# Patient Record
Sex: Female | Born: 1965
Health system: Southern US, Community
[De-identification: ages and names within clinical notes are randomized; demographics above are authoritative.]

## PROBLEM LIST (undated history)

## (undated) DIAGNOSIS — E119 Type 2 diabetes mellitus without complications: Secondary | ICD-10-CM

## (undated) HISTORY — DX: Type 2 diabetes mellitus without complications: E11.9

## (undated) HISTORY — PX: APPENDECTOMY: SHX54

---

## 2008-06-07 ENCOUNTER — Ambulatory Visit: Payer: Self-pay | Admitting: Diagnostic Radiology

## 2008-06-07 ENCOUNTER — Emergency Department (HOSPITAL_BASED_OUTPATIENT_CLINIC_OR_DEPARTMENT_OTHER): Admission: EM | Admit: 2008-06-07 | Discharge: 2008-06-08 | Payer: Self-pay | Admitting: Emergency Medicine

## 2008-06-08 ENCOUNTER — Ambulatory Visit: Payer: Self-pay | Admitting: Diagnostic Radiology

## 2009-12-20 IMAGING — CR DG CHEST 2V
2 series · 2 of 2 positions shown · non-contrast
Comparison: None.

CLINICAL DATA: 42-year-old female with numbness in the hands and
legs.

CHEST - 2 VIEW

[w chest pa]
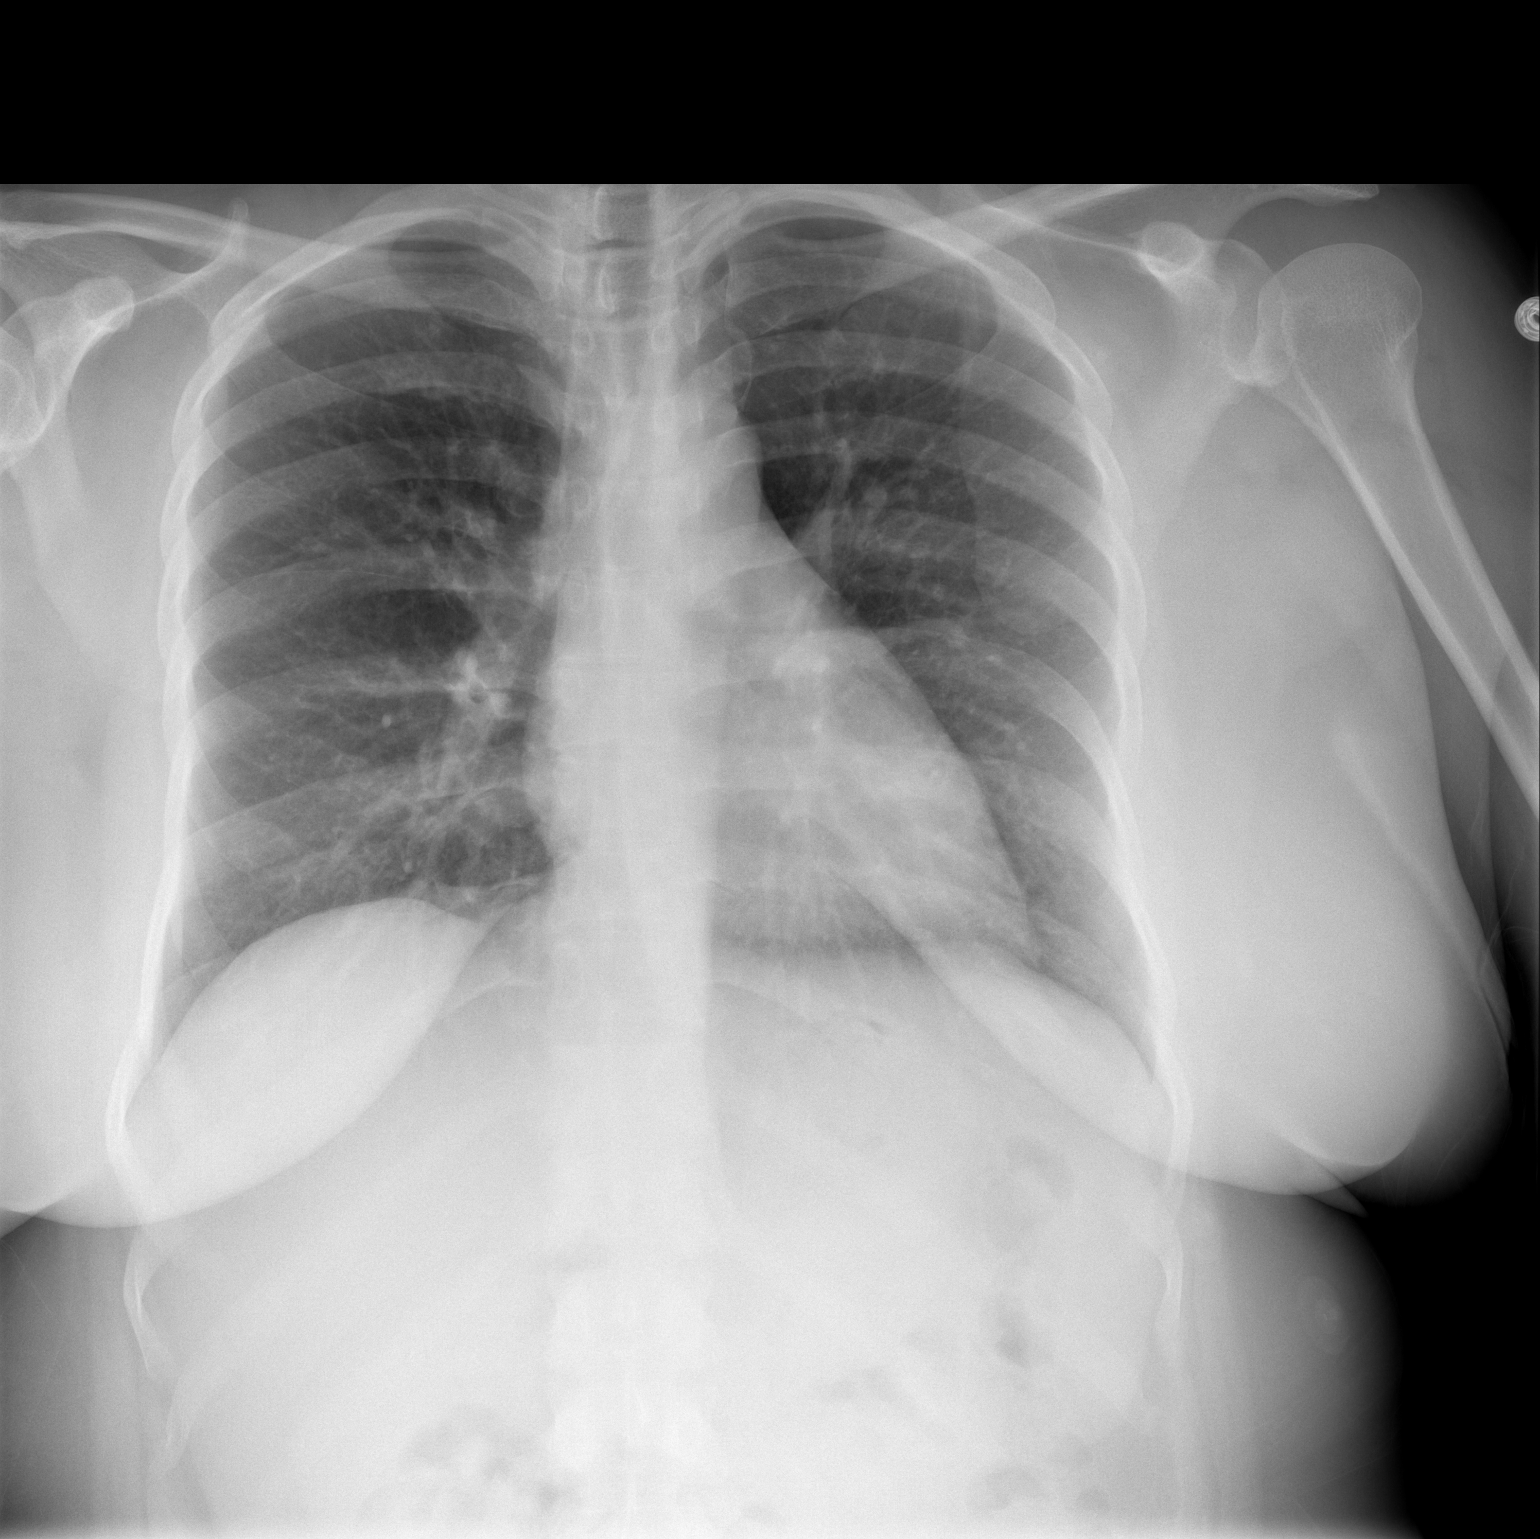

[w chest lat]
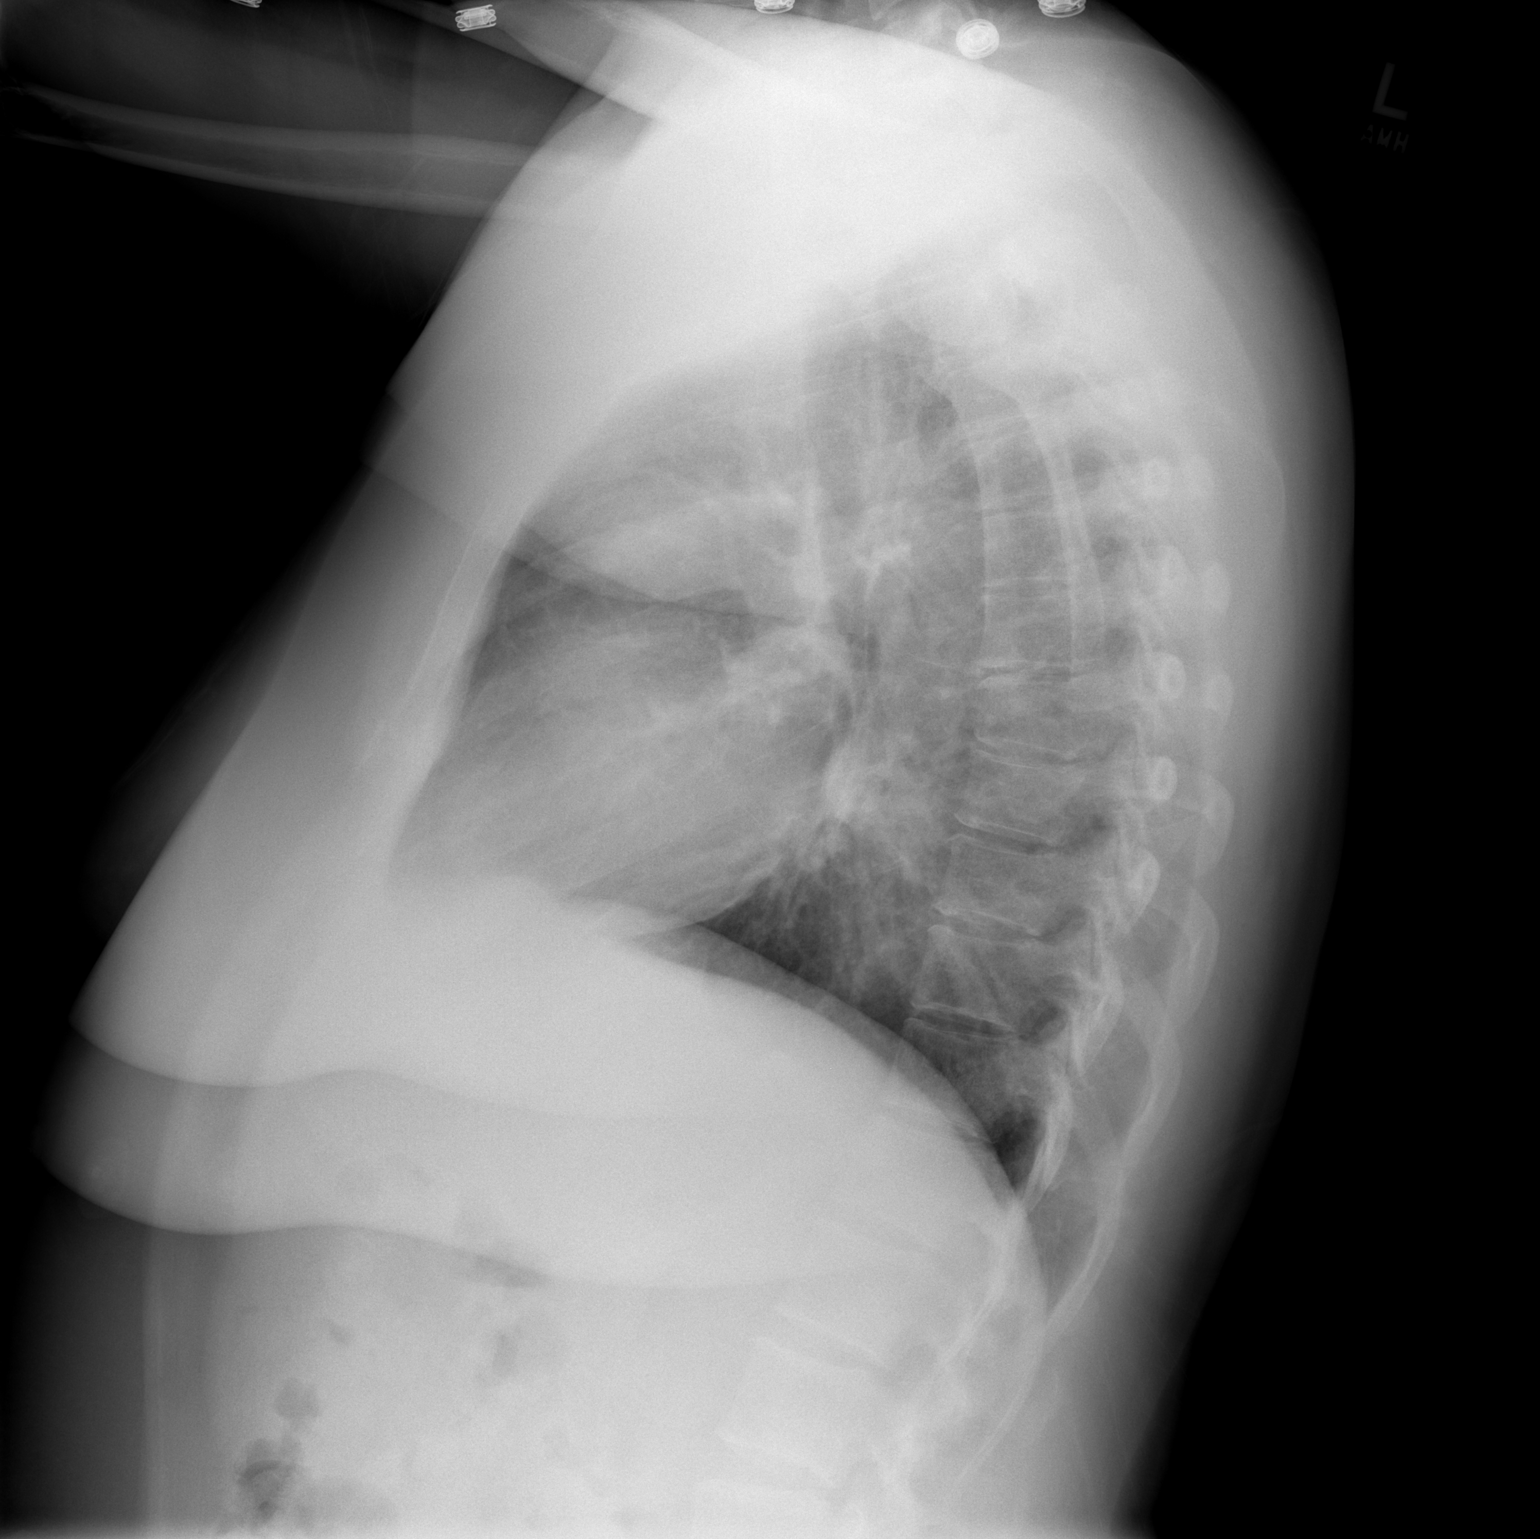

[2 of 2 positions shown; findings below may reference images not displayed]

FINDINGS: Cardiac size and mediastinal contours are within normal
limits.  Lung volumes are within normal limits.  No pneumothorax,
pulmonary edema, pleural effusion or consolidation.  Mild increased
interstitial markings which may be related to crowding.  No
confluent airspace opacity. No acute osseous abnormality
identified.
IMPRESSION: No acute cardiopulmonary abnormality.

## 2009-12-21 IMAGING — CT CT HEAD W/O CM
1 series · 16 of 30 positions shown, 20 images · non-contrast
Comparison: None.

CLINICAL DATA: 42-year-old female with numbness and pain in the
left hand and legs with onset today.

CT HEAD WITHOUT CONTRAST
TECHNIQUE: Contiguous axial images were obtained from the base of
the skull through the vertex without contrast.

[Series 2: head 4.8 h37s · axial · 0.40mm/px · z∈[-150,-10]mm · 16 of 32 slices shown, 20 images]
[im 2/32  brain]
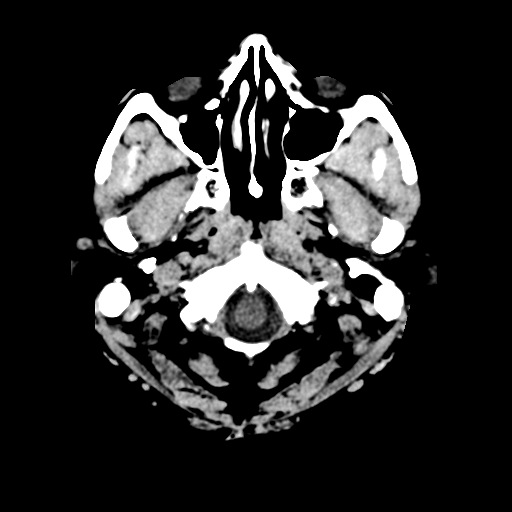
[im 2/32  bone]
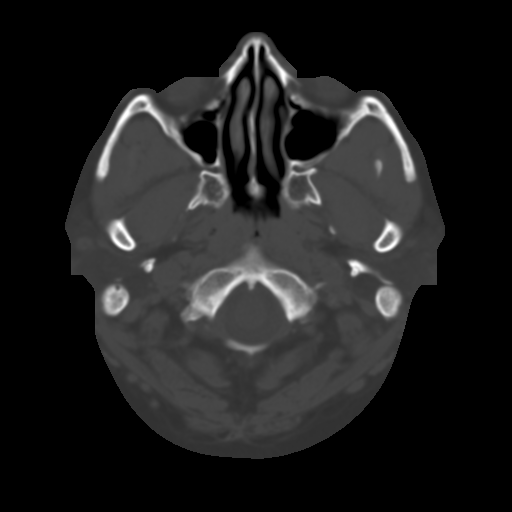
[im 4/32  brain]
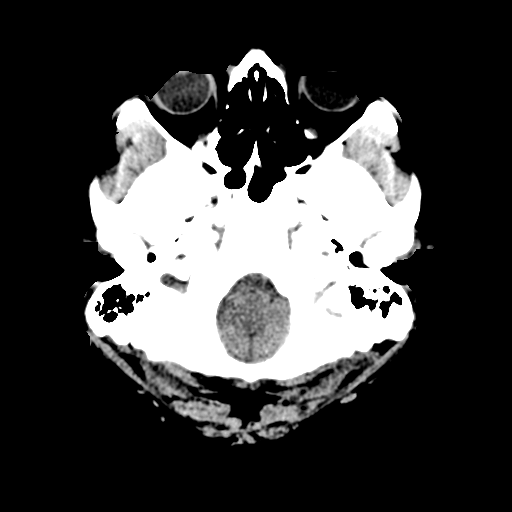
[im 6/32  brain]
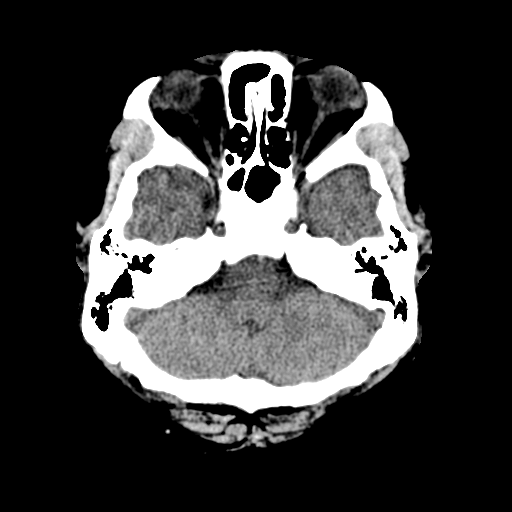
[im 8/32  brain]
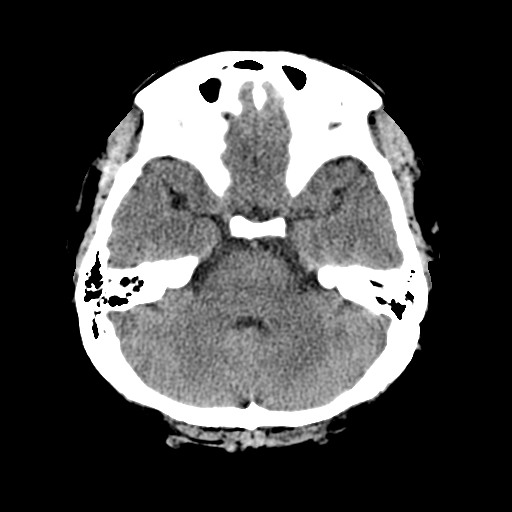
[im 9/32  brain]
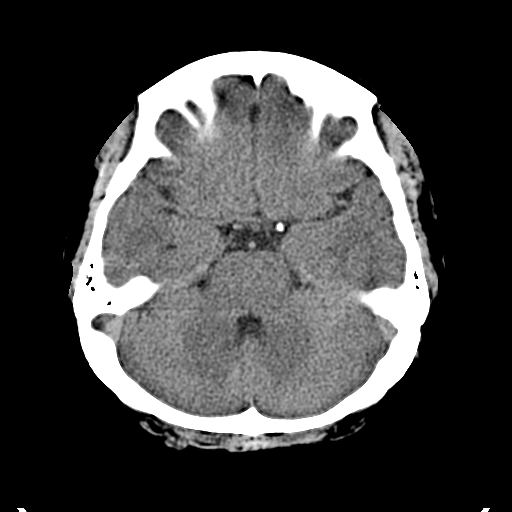
[im 9/32  bone]
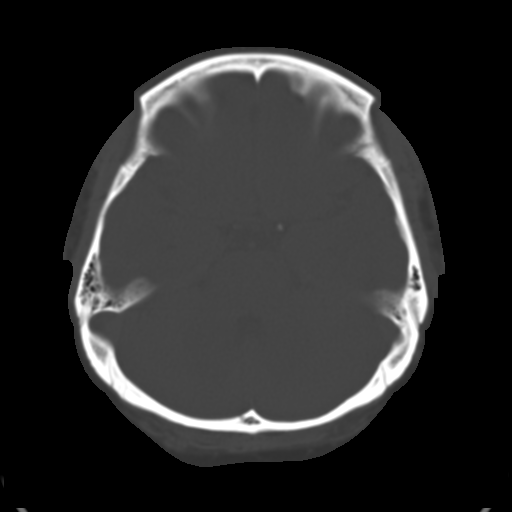
[im 11/32  brain]
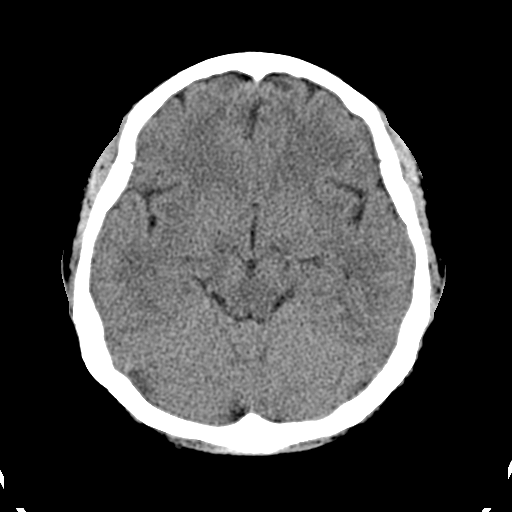
[im 13/32  brain]
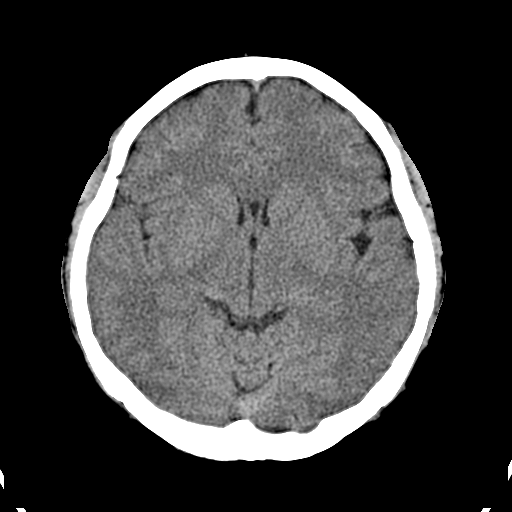
[im 15/32  brain]
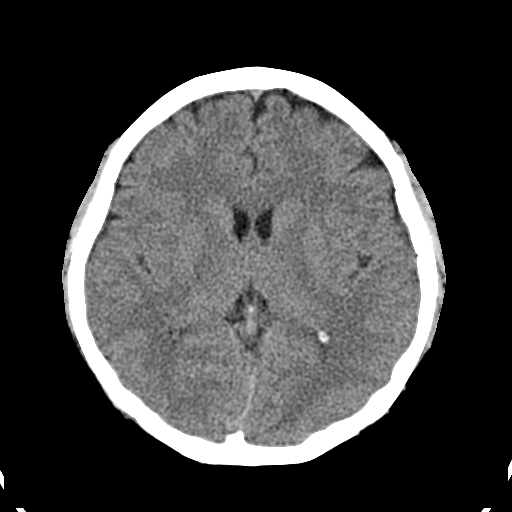
[im 17/32  brain]
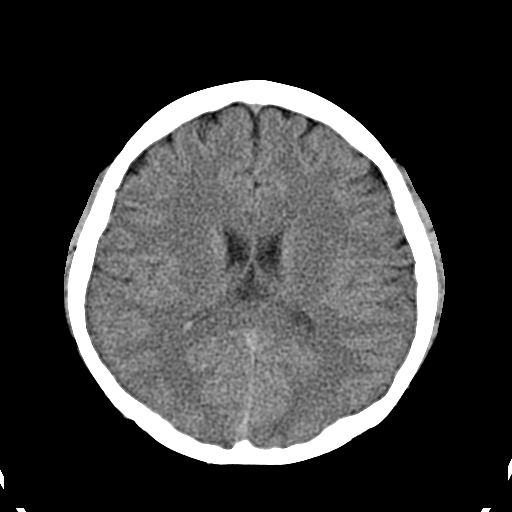
[im 17/32  bone]
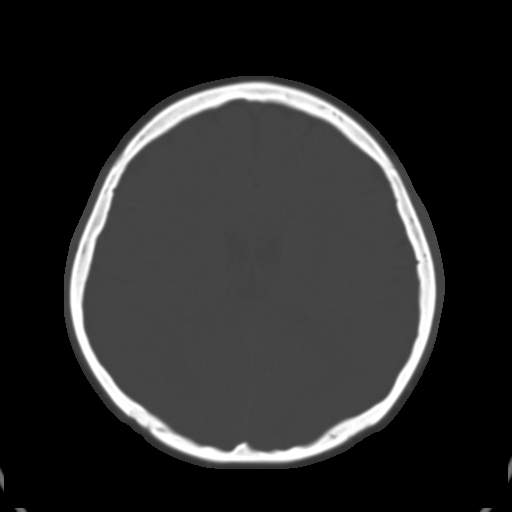
[im 19/32  brain]
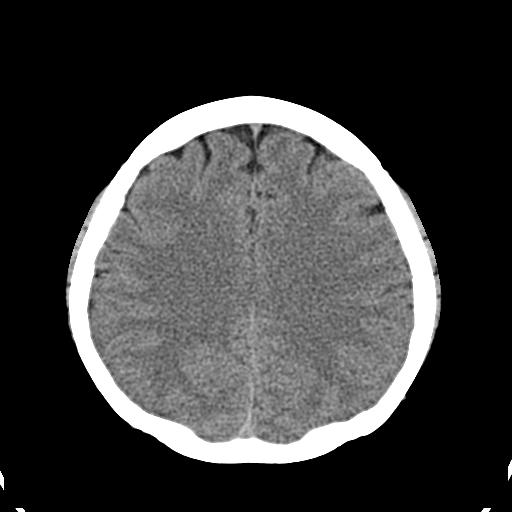
[im 21/32  brain]
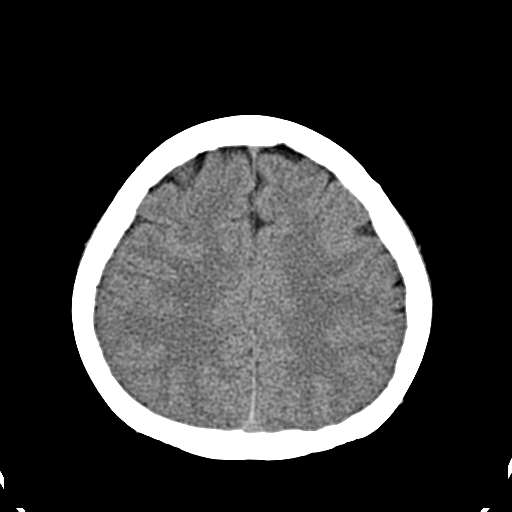
[im 23/32  brain]
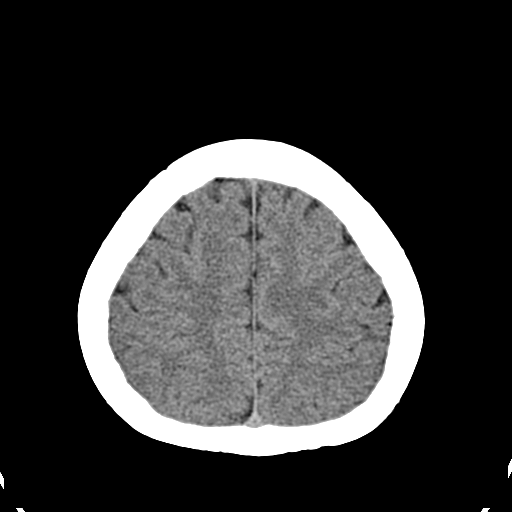
[im 24/32  brain]
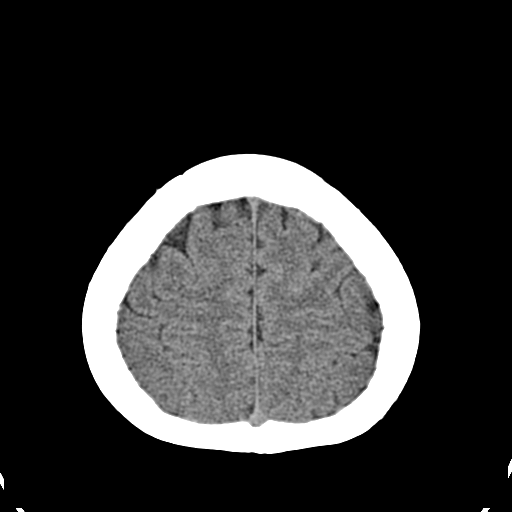
[im 24/32  bone]
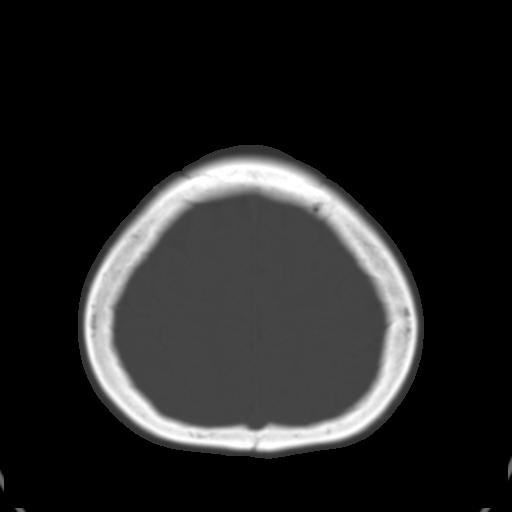
[im 26/32  brain]
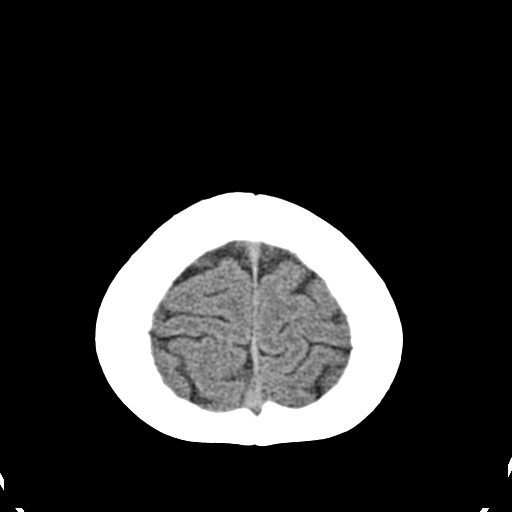
[im 28/32  brain]
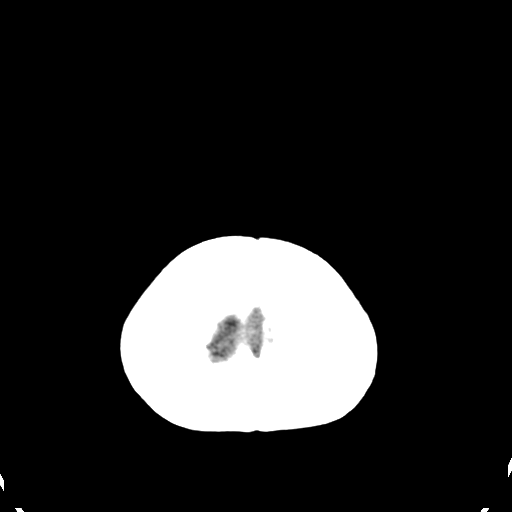
[im 30/32  brain]
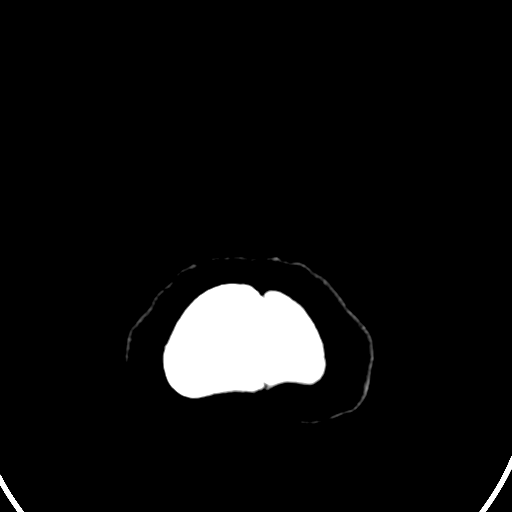

[16 of 30 positions shown; findings below may reference images not displayed]

FINDINGS: Visualized orbits and scalp soft tissues are within
normal limits.  Mild paranasal sinus mucosal thickening.
Visualized mastoids and tympanic cavities are clear. No acute
osseous abnormality identified.

Cerebral volume is within normal limits for age.  No midline shift,
mass effect or ventriculomegaly.  Gray-white matter differentiation
is within normal limits throughout the brain.  No evidence of acute
cortically based infarct identified.  No acute intracranial
hemorrhage identified.
IMPRESSION: No acute intracranial abnormality.

## 2011-03-17 LAB — URINALYSIS, ROUTINE W REFLEX MICROSCOPIC
Bilirubin Urine: NEGATIVE
Ketones, ur: NEGATIVE mg/dL
Nitrite: NEGATIVE
Protein, ur: NEGATIVE mg/dL
Urobilinogen, UA: 1 mg/dL (ref 0.0–1.0)
pH: 6.5 (ref 5.0–8.0)

## 2011-03-17 LAB — CBC
HCT: 40.3 % (ref 36.0–46.0)
MCHC: 33.5 g/dL (ref 30.0–36.0)
MCV: 82.5 fL (ref 78.0–100.0)
Platelets: 247 10*3/uL (ref 150–400)
RDW: 13.3 % (ref 11.5–15.5)
WBC: 10.1 10*3/uL (ref 4.0–10.5)

## 2011-03-17 LAB — BASIC METABOLIC PANEL
BUN: 20 mg/dL (ref 6–23)
Chloride: 107 mEq/L (ref 96–112)
Creatinine, Ser: 0.7 mg/dL (ref 0.4–1.2)
Glucose, Bld: 106 mg/dL — ABNORMAL HIGH (ref 70–99)
Potassium: 4 mEq/L (ref 3.5–5.1)

## 2011-03-17 LAB — PREGNANCY, URINE: Preg Test, Ur: NEGATIVE

## 2011-03-17 LAB — DIFFERENTIAL
Basophils Absolute: 0.1 10*3/uL (ref 0.0–0.1)
Basophils Relative: 1 % (ref 0–1)
Eosinophils Absolute: 0.2 10*3/uL (ref 0.0–0.7)
Eosinophils Relative: 2 % (ref 0–5)
Lymphs Abs: 2.6 10*3/uL (ref 0.7–4.0)
Neutrophils Relative %: 63 % (ref 43–77)

## 2015-08-03 ENCOUNTER — Ambulatory Visit: Payer: Self-pay | Attending: Internal Medicine

## 2015-08-03 ENCOUNTER — Ambulatory Visit (HOSPITAL_BASED_OUTPATIENT_CLINIC_OR_DEPARTMENT_OTHER): Payer: Self-pay | Admitting: Family Medicine

## 2015-08-03 ENCOUNTER — Encounter: Payer: Self-pay | Admitting: Family Medicine

## 2015-08-03 VITALS — BP 113/82 | HR 73 | Temp 98.3°F | Resp 15 | Ht 63.0 in | Wt 180.0 lb

## 2015-08-03 DIAGNOSIS — N611 Abscess of the breast and nipple: Secondary | ICD-10-CM

## 2015-08-03 DIAGNOSIS — E119 Type 2 diabetes mellitus without complications: Secondary | ICD-10-CM

## 2015-08-03 DIAGNOSIS — Z794 Long term (current) use of insulin: Secondary | ICD-10-CM

## 2015-08-03 LAB — POCT GLYCOSYLATED HEMOGLOBIN (HGB A1C): HEMOGLOBIN A1C: 14.1

## 2015-08-03 LAB — GLUCOSE, POCT (MANUAL RESULT ENTRY): POC Glucose: 318 mg/dl — AB (ref 70–99)

## 2015-08-03 MED FILL — CLINDAMYCIN HCL 150 MG CAPS: 150 | 10 days supply | Qty: 60 | Fill #0

## 2015-08-03 NOTE — Patient Instructions (Signed)
La diabetes mellitus y los alimentos (Diabetes Mellitus and Food) Es importante que controle su nivel de azcar en la sangre (glucosa). El nivel de glucosa en sangre depende en gran medida de lo que usted come. Comer alimentos saludables en las cantidades adecuadas a lo largo del da, aproximadamente a la misma hora todos los das, lo ayudar a controlar su nivel de glucosa en sangre. Tambin puede ayudarlo a retrasar o evitar el empeoramiento de la diabetes mellitus. Comer de manera saludable incluso puede ayudarlo a mejorar el nivel de presin arterial y a alcanzar o mantener un peso saludable.  Entre las recomendaciones generales para alimentarse y cocinar los alimentos de forma saludable, se incluyen las siguientes:  Respetar las comidas principales y comer colaciones con regularidad. Evitar pasar largos perodos sin comer con el fin de perder peso.  Seguir una dieta que consista principalmente en alimentos de origen vegetal, como frutas, vegetales, frutos secos, legumbres y cereales integrales.  Utilizar mtodos de coccin a baja temperatura, como hornear, en lugar de mtodos de coccin a alta temperatura, como frer en abundante aceite. Trabaje con el nutricionista para aprender a usar la informacin nutricional de las etiquetas de los alimentos. CMO PUEDEN AFECTARME LOS ALIMENTOS? Carbohidratos Los carbohidratos afectan el nivel de glucosa en sangre ms que cualquier otro tipo de alimento. El nutricionista lo ayudar a determinar cuntos carbohidratos puede consumir en cada comida y ensearle a contarlos. El recuento de carbohidratos es importante para mantener la glucosa en sangre en un nivel saludable, en especial si utiliza insulina o toma determinados medicamentos para la diabetes mellitus. Alcohol El alcohol puede provocar disminuciones sbitas de la glucosa en sangre (hipoglucemia), en especial si utiliza insulina o toma determinados medicamentos para la diabetes mellitus. La  hipoglucemia es una afeccin que puede poner en peligro la vida. Los sntomas de la hipoglucemia (somnolencia, mareos y desorientacin) son similares a los sntomas de haber consumido mucho alcohol.  Si el mdico lo autoriza a beber alcohol, hgalo con moderacin y siga estas pautas:  Las mujeres no deben beber ms de un trago por da, y los hombres no deben beber ms de dos tragos por da. Un trago es igual a:  12 onzas (355 ml) de cerveza  5 onzas de vino (150 ml) de vino  1,5onzas (45ml) de bebidas espirituosas  No beba con el estmago vaco.  Mantngase hidratado. Beba agua, gaseosas dietticas o t helado sin azcar.  Las gaseosas comunes, los jugos y otros refrescos podran contener muchos carbohidratos y se deben contar. QU ALIMENTOS NO SE RECOMIENDAN? Cuando haga las elecciones de alimentos, es importante que recuerde que todos los alimentos son distintos. Algunos tienen menos nutrientes que otros por porcin, aunque podran tener la misma cantidad de caloras o carbohidratos. Es difcil darle al cuerpo lo que necesita cuando consume alimentos con menos nutrientes. Estos son algunos ejemplos de alimentos que debera evitar ya que contienen muchas caloras y carbohidratos, pero pocos nutrientes:  Grasas trans (la mayora de los alimentos procesados incluyen grasas trans en la etiqueta de Informacin nutricional).  Gaseosas comunes.  Jugos.  Caramelos.  Dulces, como tortas, pasteles, rosquillas y galletas.  Comidas fritas. QU ALIMENTOS PUEDO COMER? Consuma alimentos ricos en nutrientes, que nutrirn el cuerpo y lo mantendrn saludable. Los alimentos que debe comer tambin dependern de varios factores, como:  Las caloras que necesita.  Los medicamentos que toma.  Su peso.  El nivel de glucosa en sangre.  El nivel de presin arterial.  El nivel de colesterol.   Debe consumir una amplia variedad de alimentos, por ejemplo:  Protenas.  Cortes de carne  magros.  Protenas con bajo contenido de grasas saturadas, como pescado, clara de huevo y frijoles. Evite las carnes procesadas.  Frutas y vegetales.  Frutas y vegetales que pueden ayudar a controlar los niveles sanguneos de glucosa, como manzanas, mangos y batatas.  Productos lcteos.  Elija productos lcteos sin grasa o con bajo contenido de grasa, como leche, yogur y queso.  Cereales, panes, pastas y arroz.  Elija cereales integrales, como panes multicereales, avena en grano y arroz integral. Estos alimentos pueden ayudar a controlar la presin arterial.  Grasas.  Alimentos que contengan grasas saludables, como frutos secos, aguacate, aceite de oliva, aceite de canola y pescado. TODOS LOS QUE PADECEN DIABETES MELLITUS TIENEN EL MISMO PLAN DE COMIDAS? Dado que todas las personas que padecen diabetes mellitus son distintas, no hay un solo plan de comidas que funcione para todos. Es muy importante que se rena con un nutricionista que lo ayudar a crear un plan de comidas adecuado para usted.   Esta informacin no tiene como fin reemplazar el consejo del mdico. Asegrese de hacerle al mdico cualquier pregunta que tenga.   Document Released: 09/05/2007 Document Revised: 06/19/2014 Elsevier Interactive Patient Education 2016 [employer]  

## 2015-08-03 NOTE — Progress Notes (Signed)
Here to establish care Reports no pain Patient reports she cannot read She does not take any medications but should be on some for her diabetes

## 2015-08-04 MED FILL — HumaLOG 100 UNIT/ML SOLN: 100 | 30 days supply | Qty: 10 | Fill #0

## 2015-08-04 NOTE — Progress Notes (Signed)
Subjective:  Patient ID: Teresa Mason, female    DOB: 04/24/1966  Age: 50 y.o. MRN: [id number]  CC: Establish Care   HPI Teresa Mason is a 50 year old female with a history of type 2 diabetes mellitus who has not been on any medications and comes into the clinic to establish care. She recently had an incision of the right breast abscess that [facility] and was placed on clindamycin along with insulin and metformin which she never picked up due to cost. She is scheduled for follow-up visit in 2 days' time.  She has no complaints today  No outpatient prescriptions prior to visit.   No facility-administered medications prior to visit.    ROS Review of Systems  Constitutional: Negative for activity change, appetite change and fatigue.  HENT: Negative for congestion, sinus pressure and sore throat.   Eyes: Negative for visual disturbance.  Respiratory: Negative for cough, chest tightness, shortness of breath and wheezing.   Cardiovascular: Negative for chest pain and palpitations.  Gastrointestinal: Negative for abdominal pain, constipation and abdominal distention.  Endocrine: Negative for polydipsia.  Genitourinary: Negative for dysuria and frequency.  Musculoskeletal: Negative for back pain and arthralgias.  Skin:       Right breast abscess  Neurological: Negative for tremors, light-headedness and numbness.  Hematological: Does not bruise/bleed easily.  Psychiatric/Behavioral: Negative for behavioral problems and agitation.    Objective:  BP 113/82 mmHg  Pulse 73  Temp(Src) 98.3 F (36.8 C)  Resp 15  Ht  (1.6 m)  Wt 180 lb (81.647 kg)  BMI 31.89 kg/m2  SpO2 99%  LMP 03/03/2015  BP/Weight 08/03/2015  Systolic BP 113  Diastolic BP 82  Wt. (Lbs) 180  BMI 31.89      Physical Exam  Constitutional: She is oriented to person, place, and time. She appears well-developed and well-nourished.  Cardiovascular: Normal rate, normal heart sounds and intact distal  pulses.   No murmur heard. Pulmonary/Chest: Effort normal and breath sounds normal. She has no wheezes. She has no rales. She exhibits no tenderness.  Abdominal: Soft. Bowel sounds are normal. She exhibits no distension and no mass. There is no tenderness.  Genitourinary: There is breast tenderness (right breast abscess cavity at 9:00 with surrounding tenderness in breasts no bleeding).  Musculoskeletal: Normal range of motion.  Neurological: She is alert and oriented to person, place, and time.    Lab Results  Component Value Date   HGBA1C 14.1 08/03/2015    Assessment & Plan:   1. Type 2 diabetes mellitus without complication, with long-term current use of insulin (HCC) A1c 14.1 Patient has been directed to the pharmacy to obtain her medications there as it is more cost effective for her and I will see her back in 3 weeks to reassess. Blood sugar log. Also to apply for the [place] discount - HgB A1c - Glucose (CBG) - insulin glargine (LANTUS) 100 UNIT/ML injection; Inject 15 Units into the skin at bedtime. Reported on 08/03/2015 - insulin lispro (HUMALOG) 100 UNIT/ML injection; Inject 0-18 Units into the skin 3 (three) times daily with meals. Reported on 08/03/2015 - metFORMIN (GLUCOPHAGE) 500 MG tablet; Take 250 mg by mouth 2 (two) times daily with a meal. Reported on 08/03/2015  2. Intramammary breast abscess Advised to keep follow-up appointment with Breast surgeons in [facility]. - clindamycin (CLEOCIN) 300 MG capsule; Take 300 mg by mouth every 8 (eight) hours. Reported on 08/03/2015 - oxyCODONE-acetaminophen (PERCOCET/ROXICET) 5-325 MG tablet; Take by mouth every  6 (six) hours as needed for severe pain. Reported on 08/03/2015   No orders of the defined types were placed in this encounter.    Follow-up: Return in about 3 weeks (around 08/24/2015) for Follow-up on diabetes mellitus.   Jaclyn Shaggy MD

## 2015-08-16 ENCOUNTER — Ambulatory Visit: Payer: Self-pay | Admitting: Family Medicine

## 2015-09-06 ENCOUNTER — Ambulatory Visit: Payer: Self-pay | Admitting: Internal Medicine
